# Patient Record
Sex: Male | Born: 2000 | Race: Asian | Hispanic: No | Marital: Single | Smoking: Never smoker
Health system: Southern US, Community
[De-identification: ages and names within clinical notes are randomized; demographics above are authoritative.]

## PROBLEM LIST (undated history)

## (undated) HISTORY — PX: CIRCUMCISION: SUR203

## (undated) HISTORY — PX: SINOSCOPY: SHX187

---
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
## 2020-01-16 ENCOUNTER — Ambulatory Visit (INDEPENDENT_AMBULATORY_CARE_PROVIDER_SITE_OTHER): Payer: 59

## 2020-01-16 ENCOUNTER — Other Ambulatory Visit: Payer: Self-pay

## 2020-01-16 DIAGNOSIS — J309 Allergic rhinitis, unspecified: Secondary | ICD-10-CM | POA: Diagnosis not present

## 2020-01-29 ENCOUNTER — Telehealth: Payer: Self-pay

## 2020-01-29 NOTE — Telephone Encounter (Signed)
A nurse from [facility] called requesting patients allergy injection records because he was in office for a follow up. Nurse was informed that patient receives his injections in our [place] office and unfortunately that office isn't open until Tuesday. I informed nurse that records will be sent to [phone] in the morning. I did give her information on last injection

## 2020-01-30 NOTE — Telephone Encounter (Signed)
Records have been faxed to [phone].

## 2020-02-20 ENCOUNTER — Ambulatory Visit (INDEPENDENT_AMBULATORY_CARE_PROVIDER_SITE_OTHER): Payer: 59

## 2020-02-20 ENCOUNTER — Telehealth: Payer: Self-pay

## 2020-02-20 ENCOUNTER — Other Ambulatory Visit: Payer: Self-pay

## 2020-02-20 DIAGNOSIS — J309 Allergic rhinitis, unspecified: Secondary | ICD-10-CM

## 2020-02-20 NOTE — Telephone Encounter (Signed)
Patient needs to sign reorder form for new vials to be made. Patient also mentioned about going to school and needed his vials sent to school to have his injections there, but we're not his allergist so he needs to contact his original allergist to get his vials sent to the school.

## 2020-03-19 ENCOUNTER — Telehealth: Payer: Self-pay

## 2020-03-19 NOTE — Telephone Encounter (Signed)
Patient called stating that he is in school at [employer] and will need to start the process to have his vials transferred. Patient emailed me the form that [facility] needs filled out. Form has been printed. I informed patient that we will fill forms out and fax them back along with the our administration of immunotherapy forms. I informed patient we will call him when they are faxed because he will need to go sign the forms as well then have them faxed back. Patient verbalized understanding. Once we get the forms back

## 2020-03-26 NOTE — Telephone Encounter (Signed)
Forms have been complete, signed and faxed th [facility]. Once receive our forms back signed we will set up getting the vials to the patient. I called to inform patient that forms were faxed over so he could go sign them prior to them sending them back however no answer and mailbox was full.

## 2020-03-27 NOTE — Telephone Encounter (Signed)
Called again and his mom answered and stated that he was not there. She stated that she would call him and have him call us back tomorrow.

## 2020-03-28 NOTE — Telephone Encounter (Signed)
I have received forms back signed. When patient calls the office will need to see where to mail vials to or if patient will be picking them up.

## 2020-04-01 NOTE — Telephone Encounter (Signed)
Spoke with patient and he is going to find out where we should mail vials to.

## 2020-04-02 NOTE — Progress Notes (Signed)
Immunotherapy   Patient Details  Name: Stanley Hicks MRN: [id number] Date of Birth: 2001-07-10  04/02/2020  Barbie Banner vials have been mailed out to [facility], [address] [employer], [id number], [place], [place] [address], telephone # [phone] Fax # [phone]. Vials were taken to the post office for mail out.  Following schedule: Schedule is different for all three vials. Frequency: every 2-4 weeks Epi-Pen: Yes Consent signed and patient instructions given.    Dub Mikes 04/02/2020, 9:24 AM

## 2020-04-02 NOTE — Telephone Encounter (Signed)
Spoke with patient and he informed me to mail vials to [facility], [address] [employer], [id number], [place], [place] [address]. Telephone [phone] Fax # [phone]. I informed patient that I would get them mailed out today 04/02/2020. Patient verbalized understand.

## 2020-04-02 NOTE — Telephone Encounter (Signed)
Vials were taken to the post office and mailed out. Tracking number is [phone] [id number]. You can text the tracking number to [phone] ([email or web address]) for updates.
